# Patient Record
Sex: Female | Born: 1995 | Race: White | Hispanic: No | Marital: Single | Smoking: Never smoker
Health system: Southern US, Community
[De-identification: ages and names within clinical notes are randomized; demographics above are authoritative.]

## PROBLEM LIST (undated history)

## (undated) DIAGNOSIS — F419 Anxiety disorder, unspecified: Secondary | ICD-10-CM

## (undated) DIAGNOSIS — N390 Urinary tract infection, site not specified: Secondary | ICD-10-CM

## (undated) DIAGNOSIS — D279 Benign neoplasm of unspecified ovary: Secondary | ICD-10-CM

## (undated) HISTORY — PX: HERNIA REPAIR: SHX51

## (undated) HISTORY — DX: Anxiety disorder, unspecified: F41.9

## (undated) HISTORY — DX: Benign neoplasm of unspecified ovary: D27.9

## (undated) HISTORY — DX: Urinary tract infection, site not specified: N39.0

## (undated) HISTORY — PX: OOPHORECTOMY: SHX86

## (undated) HISTORY — PX: TERATOMA EXCISION: SHX2491

---
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
## 2014-11-14 ENCOUNTER — Ambulatory Visit (INDEPENDENT_AMBULATORY_CARE_PROVIDER_SITE_OTHER): Payer: BLUE CROSS/BLUE SHIELD | Admitting: Family Medicine

## 2014-11-14 ENCOUNTER — Encounter: Payer: Self-pay | Admitting: Family Medicine

## 2014-11-14 VITALS — BP 92/60 | HR 81 | Temp 98.0°F | Wt 116.0 lb

## 2014-11-14 DIAGNOSIS — J209 Acute bronchitis, unspecified: Secondary | ICD-10-CM | POA: Insufficient documentation

## 2014-11-14 MED ORDER — AZITHROMYCIN 250 MG PO TABS: ORAL_TABLET | ORAL | Status: DC

## 2014-11-14 MED ORDER — HYDROCODONE-HOMATROPINE 5-1.5 MG/5ML PO SYRP: 5.0000 mL | ORAL_SOLUTION | Freq: Three times a day (TID) | ORAL | Status: DC | PRN

## 2014-11-14 NOTE — Patient Instructions (Signed)
I think you have bronchitis / in conjunction with a head and chest cold  However- since you were exposed to pneumonia we need to cover you with an antibiotic Take zpak as directed Hycodan for cough - with caution of sedation Drink lots of fluids and rest Heat on your back  Ibuprofen for pain as well    Update if not starting to improve in a week or if worsening

## 2014-11-14 NOTE — Progress Notes (Signed)
Pre visit review using our clinic review tool, if applicable. No additional management support is needed unless otherwise documented below in the visit note. 

## 2014-11-14 NOTE — Progress Notes (Signed)
Subjective:    Patient ID: Jessica Oneill, female    DOB: 1995/11/08, 19 y.o.   MRN: [id number]  HPI Here for uri symptoms for 1 week  Has improved but not gone   Congestion Cough prod of clear phlegm   No fever or chills   Had some ST at first -better now  Some sinus pain and pressure with congestion - no purulent drainage 3  Low back pain  Worse when she coughs   Was exp to pneumonia  (nursing student)  Patient Active Problem List   Diagnosis Date Noted  . Generalized anxiety disorder 04/18/2012  . Teratoma of ovary, hx of - followed by Dr. Genella Mech in [place] and [place], s/p resection 04/18/2012   Past Medical History  Diagnosis Date  . Recurrent urinary tract infection   . Anxiety   . Teratoma of ovary, hx of - followed by Dr. Genella Mech in [place] and [place], s/p resection 04/18/2012   Past Surgical History  Procedure Laterality Date  . Hernia repair      x2  . Teratoma excision      age 69  . Oophorectomy     Social History  Substance Use Topics  . Smoking status: Never Smoker   . Smokeless tobacco: Never Used  . Alcohol Use: No   Family History  Problem Relation Age of Onset  . Cancer Mother 99    leukemia - hairy cell  . Mental retardation Mother   . Diabetes Paternal Grandmother    No Known Allergies No current outpatient prescriptions on file prior to visit.   No current facility-administered medications on file prior to visit.    Review of Systems Review of Systems  Constitutional: Negative for fever, appetite change,  and unexpected weight change.  ENT pos for cong and rhinorrhea and sinus pressure  Eyes: Negative for pain and visual disturbance.  Respiratory: Negative for wheeze and shortness of breath.   Cardiovascular: Negative for cp or palpitations    Gastrointestinal: Negative for nausea, diarrhea and constipation.  Genitourinary: Negative for urgency and frequency.  Skin: Negative for pallor or rash   Neurological: Negative for  weakness, light-headedness, numbness and headaches.  Hematological: Negative for adenopathy. Does not bruise/bleed easily.  Psychiatric/Behavioral: Negative for dysphoric mood. The patient is not nervous/anxious.         Objective:   Physical Exam  Constitutional: She appears well-developed and well-nourished. No distress.  HENT:  Head: Normocephalic and atraumatic.  Right Ear: External ear normal.  Left Ear: External ear normal.  Mouth/Throat: Oropharynx is clear and moist.  Nares are injected and congested  No sinus tenderness Clear rhinorrhea and post nasal drip   Eyes: Conjunctivae and EOM are normal. Pupils are equal, round, and reactive to light. Right eye exhibits no discharge. Left eye exhibits no discharge.  Neck: Normal range of motion. Neck supple.  Cardiovascular: Normal rate and normal heart sounds.   Pulmonary/Chest: Effort normal and breath sounds normal. No respiratory distress. She has no wheezes. She has no rales. She exhibits no tenderness.  Harsh bs No rales or rhonchi  Good air exch   Lymphadenopathy:    She has no cervical adenopathy.  Neurological: She is alert.  Skin: Skin is warm and dry. No rash noted.  Psychiatric: She has a normal mood and affect.          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

## 2014-11-14 NOTE — Assessment & Plan Note (Addendum)
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

## 2015-07-19 ENCOUNTER — Ambulatory Visit (INDEPENDENT_AMBULATORY_CARE_PROVIDER_SITE_OTHER): Payer: 59 | Admitting: *Deleted

## 2015-07-19 DIAGNOSIS — Z111 Encounter for screening for respiratory tuberculosis: Secondary | ICD-10-CM | POA: Diagnosis not present

## 2015-07-21 LAB — TB SKIN TEST
INDURATION: 0 mm
TB SKIN TEST: NEGATIVE

## 2015-07-21 NOTE — Addendum Note (Signed)
Addended by: Sandria Bales B on: 07/21/2015 02:05 PM   Modules accepted: Orders

## 2015-08-02 ENCOUNTER — Ambulatory Visit (INDEPENDENT_AMBULATORY_CARE_PROVIDER_SITE_OTHER): Payer: 59 | Admitting: Family Medicine

## 2015-08-02 ENCOUNTER — Encounter: Payer: Self-pay | Admitting: Family Medicine

## 2015-08-02 VITALS — BP 100/58 | HR 86 | Temp 98.1°F | Ht 63.0 in | Wt 123.5 lb

## 2015-08-02 DIAGNOSIS — Z0000 Encounter for general adult medical examination without abnormal findings: Secondary | ICD-10-CM | POA: Diagnosis not present

## 2015-08-02 NOTE — Progress Notes (Signed)
Pre visit review using our clinic review tool, if applicable. No additional management support is needed unless otherwise documented below in the visit note. 

## 2015-08-02 NOTE — Patient Instructions (Addendum)
Check on the date of your last tetanus booster and let us know.  Vit D3 [phone] IU daily  Pap smear with your gynecologist in the fall.  We recommend the following healthy lifestyle measures: - eat a healthy whole foods diet consisting of regular small meals composed of vegetables, fruits, beans, nuts, seeds, healthy meats such as white chicken and fish and whole grains.  - avoid sweets, white starchy foods, fried foods, fast food, processed foods, sodas, red meet and other fattening foods.  - get a least 150-300 minutes of aerobic exercise per week.

## 2015-08-02 NOTE — Progress Notes (Signed)
HPI:  Here for CPE:  -Concerns and/or follow up today: none  -Diet: variety of foods, balance and well rounded  -Exercise: no regular exercise  -Taking folic acid, vitamin D or calcium: no vitamins  -Diabetes and Dyslipidemia Screening: n/a  -Hx of HTN: no  -Vaccines: UTD; she is not sure when she had her Tdap  -pap history: followed by surgeon as needed for history teratoma and sees a gynecologist in [place] now - [facility] at [place], she plans to do gyn and pap here - [phone].  -FDLMP: on continuous OCP - no periods for dysmenorrhea, followed by gyn  -sexual activity: not currently active; active  In the past - has STI testing at a clinic in [place] and reports all was normal and declines STI testing today  -Alcohol, Tobacco, drug use: see social history  Review of Systems - no fevers, unintentional weight loss, vision loss, hearing loss, chest pain, sob, hemoptysis, melena, hematochezia, hematuria, genital discharge, changing or concerning skin lesions, bleeding, bruising, loc, thoughts of self harm or SI  Past Medical History  Diagnosis Date  . Recurrent urinary tract infection   . Anxiety   . Teratoma of ovary, hx of - followed by Dr. Genella Mech in [place] and [place], s/p resection 04/18/2012    Past Surgical History  Procedure Laterality Date  . Hernia repair      x2  . Teratoma excision      age 66  . Oophorectomy      Family History  Problem Relation Age of Onset  . Cancer Mother 2    leukemia - hairy cell  . Mental retardation Mother   . Diabetes Paternal Grandmother     Social History   Social History  . Marital Status: Single    Spouse Name: N/A  . Number of Children: N/A  . Years of Education: N/A   Social History Main Topics  . Smoking status: Never Smoker   . Smokeless tobacco: Never Used  . Alcohol Use: No  . Drug Use: No  . Sexual Activity: No   Other Topics Concern  . None   Social History Narrative   Work  or School: in [employer] school this summer - 8 weeks, just started - doing pre-nursing in [place]: lives with mother, father and brother      Spiritual Beliefs:Jewish Christian      Lifestyle: regular exercise, diet is pretty good                    Current outpatient prescriptions:  .  drospirenone-ethinyl estradiol (ZARAH) 3-0.03 MG tablet, Take 1 tablet by mouth daily., Disp: , Rfl:  .  spironolactone (ALDACTONE) 100 MG tablet, Take 100 mg by mouth daily., Disp: , Rfl:  .  tretinoin (RETIN-A) Q000111Q % cream, APPLICATION ON THE SKIN NIGHTLY, Disp: , Rfl: 1  EXAM:  Filed Vitals:   08/02/15 1451  BP: 100/58  Pulse: 86  Temp: 98.1 F (36.7 C)    GENERAL: vitals reviewed and listed below, alert, oriented, appears well hydrated and in no acute distress  HEENT: head atraumatic, PERRLA, normal appearance of eyes, ears, nose and mouth. moist mucus membranes.  NECK: supple, no masses or lymphadenopathy  LUNGS: clear to auscultation bilaterally, no rales, rhonchi or wheeze  CV: HRRR, no peripheral edema or cyanosis, normal pedal pulses  BREAST: declined  ABDOMEN: bowel sounds normal, soft, non tender to palpation, no masses, no rebound  or guarding  GU: declined  SKIN: no rash or abnormal lesions  MS: normal gait, moves all extremities normally  NEURO: CN II-XII grossly intact, normal muscle strength and sensation to light touch on extremities  PSYCH: normal affect, pleasant and cooperative  ASSESSMENT AND PLAN:  Discussed the following assessment and plan:  Visit for preventive health examination  -Discussed and advised all [place] preventive services health task force level A and B recommendations for age, sex and risks.  -Advised at least 150 minutes of exercise per week and a healthy diet low in saturated fats and sweets and consisting of fresh fruits and vegetables, lean meats such as fish and white chicken and whole grains.  -offered STI testing,  declined  -she plans to do pap and gyn exam with her gynecologist in [place], studies and vaccines per orders this encounter  No orders of the defined types were placed in this encounter.    Patient advised to return to clinic immediately if symptoms worsen or persist or new concerns.  Patient Instructions  Check on the date of your last tetanus booster and let us know.  Vit D3 [phone] IU daily  Pap smear with your gynecologist in the fall.  We recommend the following healthy lifestyle measures: - eat a healthy whole foods diet consisting of regular small meals composed of vegetables, fruits, beans, nuts, seeds, healthy meats such as white chicken and fish and whole grains.  - avoid sweets, white starchy foods, fried foods, fast food, processed foods, sodas, red meet and other fattening foods.  - get a least 150-300 minutes of aerobic exercise per week.      No Follow-up on file.  Colin Benton R.

## 2015-08-13 ENCOUNTER — Telehealth: Payer: Self-pay | Admitting: Family Medicine

## 2015-08-13 NOTE — Telephone Encounter (Signed)
I left a detailed message with the information below at the pts mothers number to call back with more information.

## 2015-08-13 NOTE — Telephone Encounter (Signed)
Pts mother was wondering if her daughter could come in and do lab work that was omitted on 08-17-2015 during her CPE and be coded as the CPX lab work.  Mother would like to have a call back.

## 2015-08-13 NOTE — Telephone Encounter (Signed)
Can you find out from the patient what lab work she is requesting? All appropriate screening labs for her age were offered at her visit. There are not many screening physical labs recommended/covred for women her age.

## 2016-10-17 ENCOUNTER — Telehealth: Payer: Self-pay | Admitting: Family Medicine

## 2016-10-17 NOTE — Telephone Encounter (Signed)
pts father is calling to see if Dr. Maudie Mercury would know of any providers that she can refer the pt to in [place] for the pt that is away in school to be her PCP there.

## 2016-10-18 NOTE — Telephone Encounter (Signed)
I am sorry -but don't know any PCPs in [place].

## 2016-10-19 NOTE — Telephone Encounter (Signed)
I called the pts father and informed him of the message below.

## 2023-05-22 ENCOUNTER — Telehealth: Payer: Self-pay | Admitting: *Deleted

## 2023-05-22 NOTE — Telephone Encounter (Signed)
 Copied from CRM [id number]. Topic: General - Other >> May 22, 2023  3:17 PM Retta Caster wrote: Reason for CRM: Patient father Garr Kalata requesting call back due to daughter is going on trip and they are requesting vaccination for yellow fever. Needs call backk on clarification on does the clinic do this or if not where can she get this done for th trip in 2 weeks [phone]

## 2023-05-22 NOTE — Telephone Encounter (Signed)
 Pt dad is aware need to check with health department
